# Patient Record
Sex: Male | Born: 1965 | Race: White | Hispanic: No | Marital: Married
Health system: Southern US, Community
[De-identification: ages and names within clinical notes are randomized; demographics above are authoritative.]

---

## 2013-07-31 ENCOUNTER — Other Ambulatory Visit: Payer: Self-pay | Admitting: Family Medicine

## 2013-07-31 DIAGNOSIS — R1013 Epigastric pain: Secondary | ICD-10-CM

## 2013-08-01 ENCOUNTER — Ambulatory Visit
Admission: RE | Admit: 2013-08-01 | Discharge: 2013-08-01 | Disposition: A | Discharge status: Home / Self Care | Payer: BC Managed Care – PPO | Source: Ambulatory Visit | Attending: Family Medicine | Admitting: Family Medicine

## 2013-08-01 DIAGNOSIS — R1013 Epigastric pain: Secondary | ICD-10-CM

## 2014-11-09 IMAGING — US US ABDOMEN LIMITED
1 series · 14 of 25 positions shown · non-contrast
Comparison: None.

CLINICAL DATA: Epigastric pain

EXAM:
US ABDOMEN LIMITED - RIGHT UPPER QUADRANT

[Series 1: us abdomen limited · 0.25mm/px · 14 of 39 slices shown]
[im 1/39]
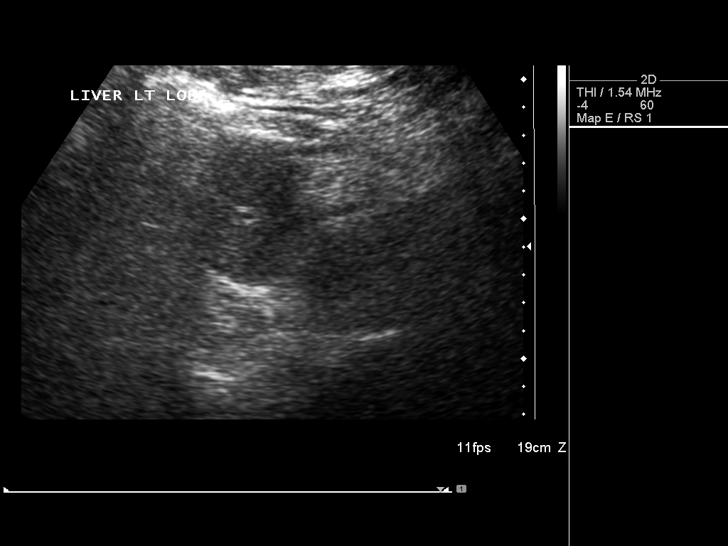
[im 4/39]
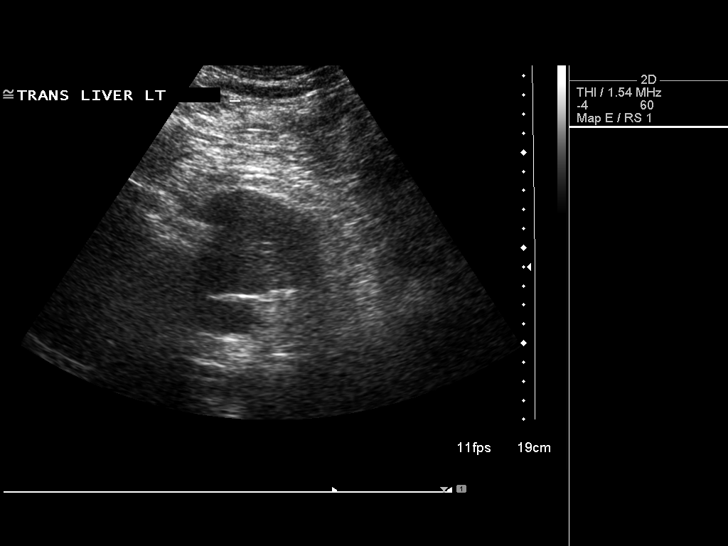
[im 7/39]
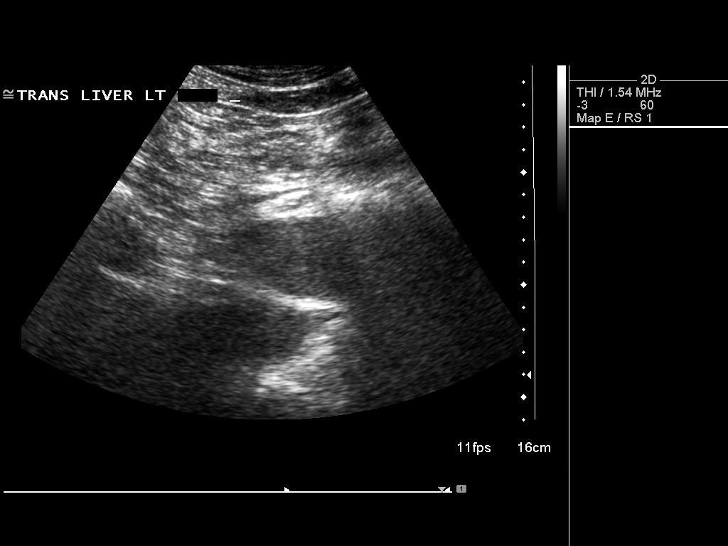
[im 10/39]
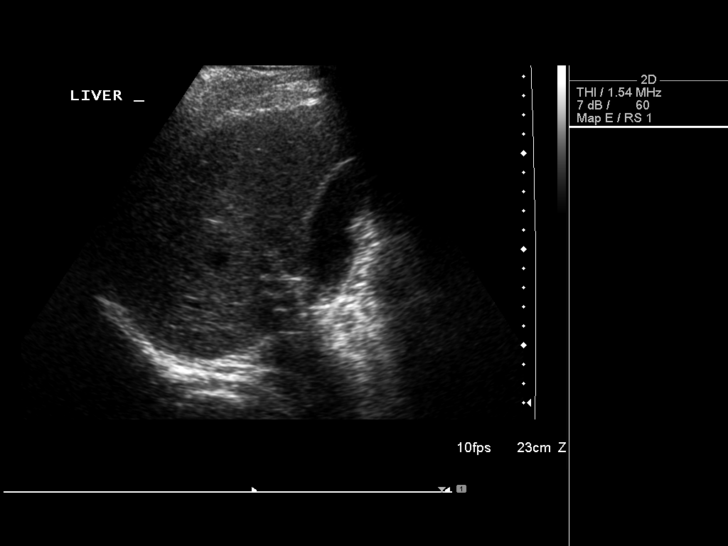
[im 13/39]
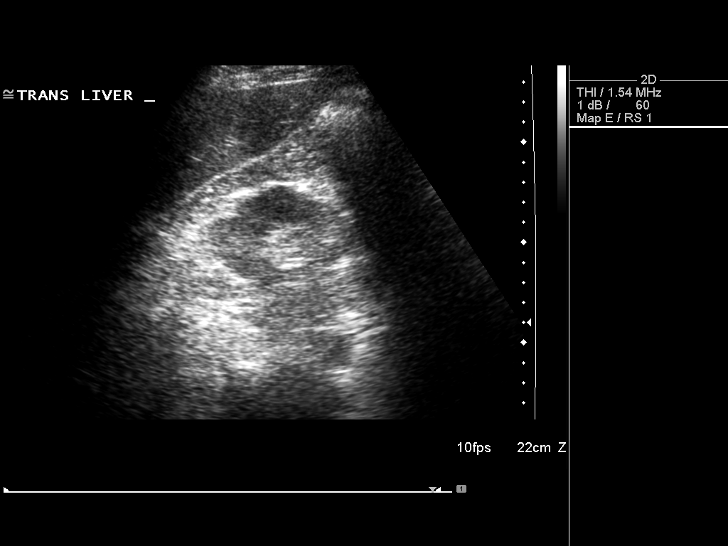
[im 15/39]
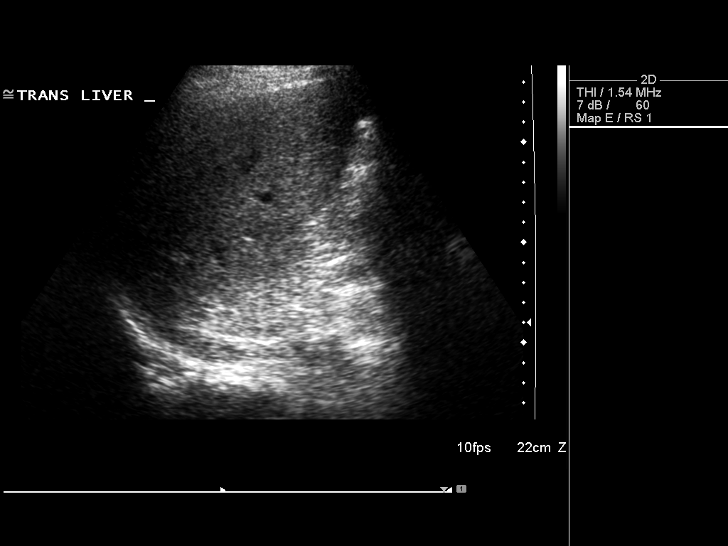
[im 18/39]
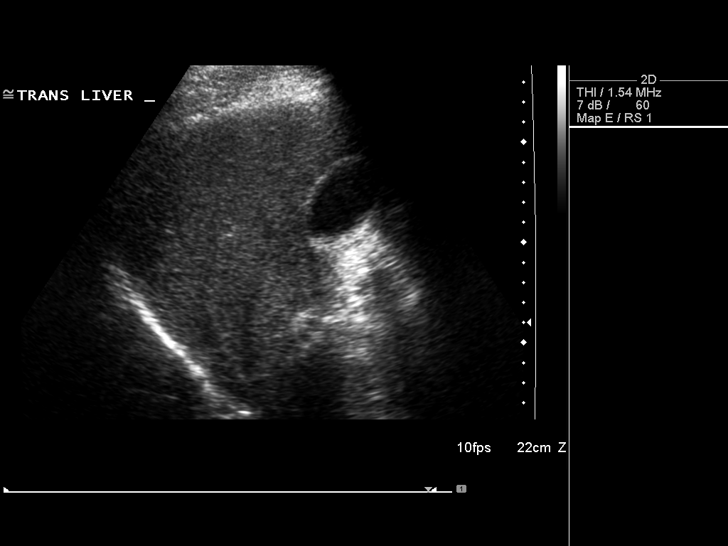
[im 21/39]
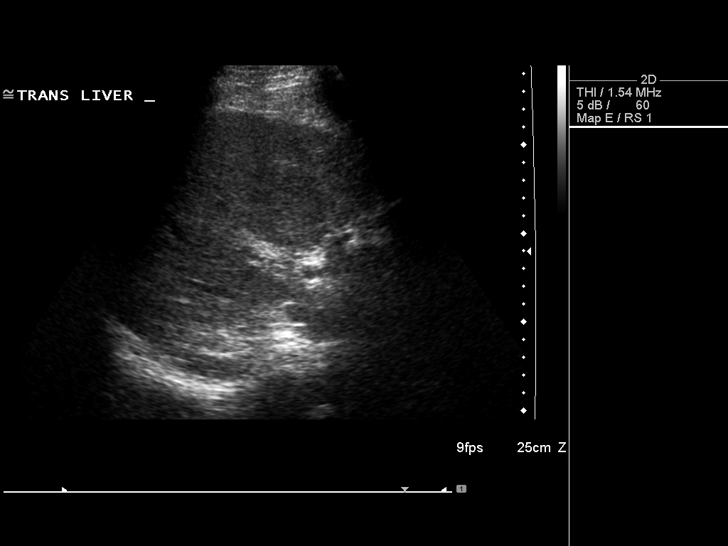
[im 24/39]
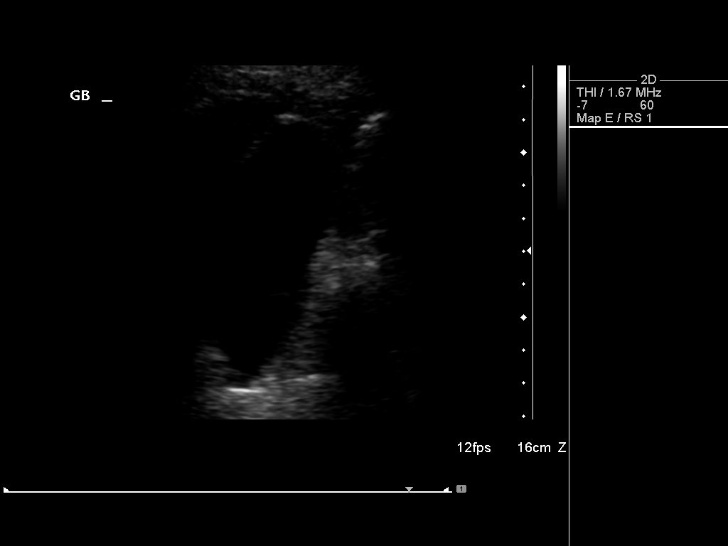
[im 26/39]
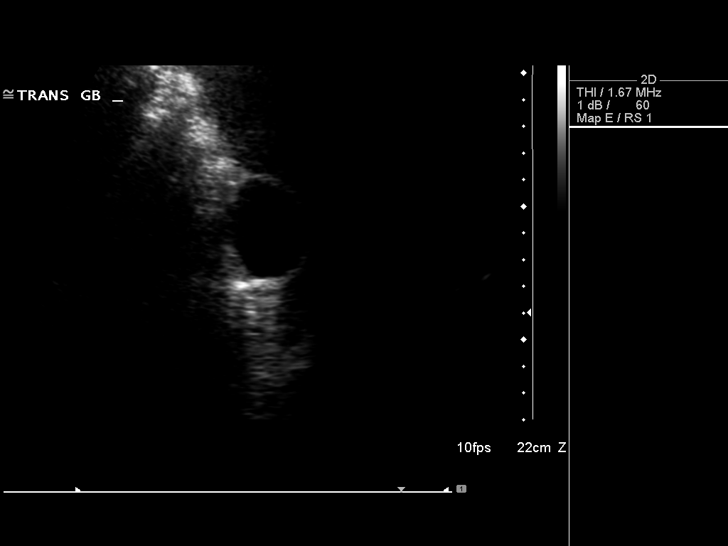
[im 29/39]
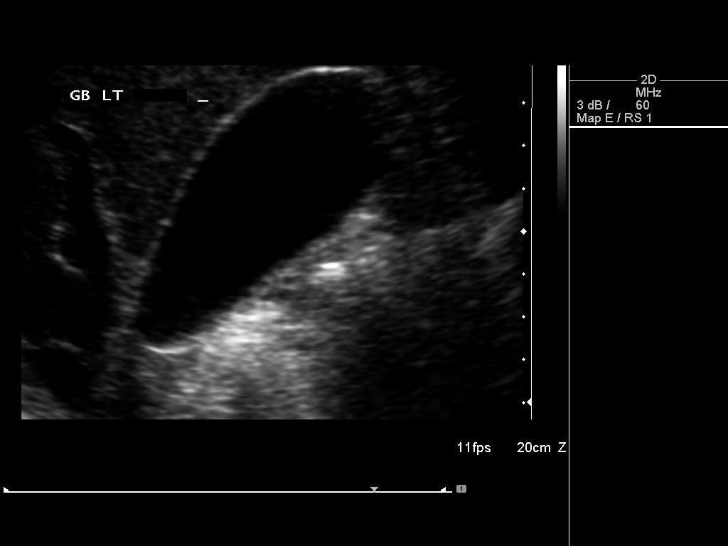
[im 32/39]
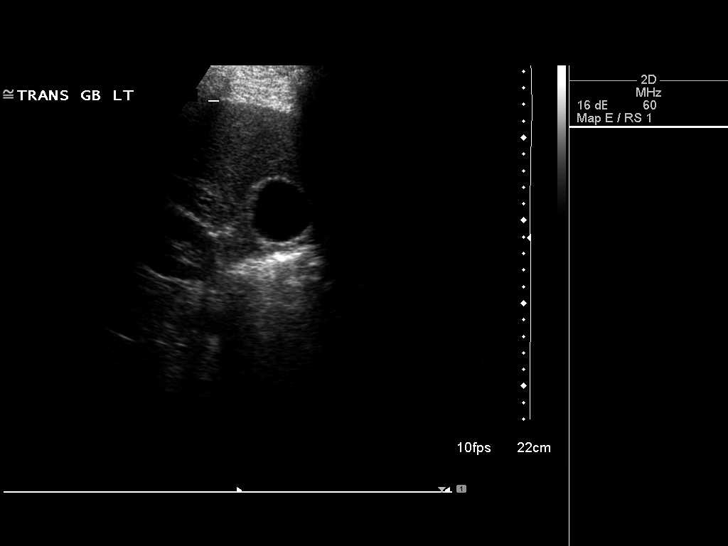
[im 35/39]
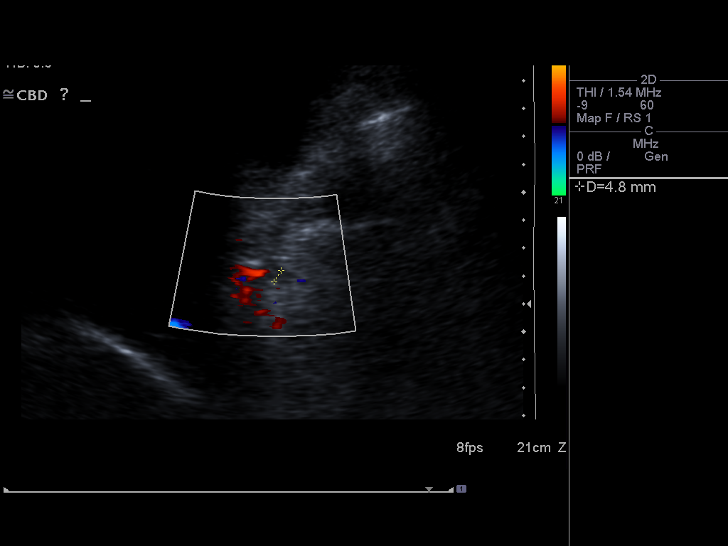
[im 39/39]
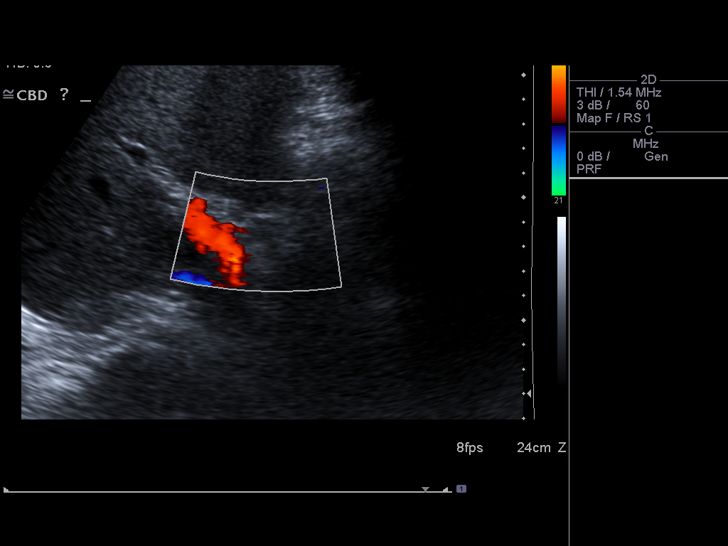

[14 of 25 positions shown; findings below may reference images not displayed]

FINDINGS: Gallbladder:

No gallstones or wall thickening visualized. No sonographic Murphy
sign noted.

Common bile duct:

Diameter: 5 mm in caliber.

Liver:

No focal lesion identified. Within normal limits in parenchymal
echogenicity.
IMPRESSION: Within normal limits.

## 2016-10-11 DIAGNOSIS — Z125 Encounter for screening for malignant neoplasm of prostate: Secondary | ICD-10-CM | POA: Diagnosis not present

## 2016-10-11 DIAGNOSIS — Z23 Encounter for immunization: Secondary | ICD-10-CM | POA: Diagnosis not present

## 2016-10-11 DIAGNOSIS — I10 Essential (primary) hypertension: Secondary | ICD-10-CM | POA: Diagnosis not present

## 2016-10-11 DIAGNOSIS — E781 Pure hyperglyceridemia: Secondary | ICD-10-CM | POA: Diagnosis not present

## 2016-10-11 DIAGNOSIS — Z0000 Encounter for general adult medical examination without abnormal findings: Secondary | ICD-10-CM | POA: Diagnosis not present

## 2017-12-06 DIAGNOSIS — Z125 Encounter for screening for malignant neoplasm of prostate: Secondary | ICD-10-CM | POA: Diagnosis not present

## 2017-12-06 DIAGNOSIS — Z0000 Encounter for general adult medical examination without abnormal findings: Secondary | ICD-10-CM | POA: Diagnosis not present

## 2017-12-06 DIAGNOSIS — I10 Essential (primary) hypertension: Secondary | ICD-10-CM | POA: Diagnosis not present

## 2018-04-17 DIAGNOSIS — K5730 Diverticulosis of large intestine without perforation or abscess without bleeding: Secondary | ICD-10-CM | POA: Diagnosis not present

## 2018-04-17 DIAGNOSIS — Z1211 Encounter for screening for malignant neoplasm of colon: Secondary | ICD-10-CM | POA: Diagnosis not present

## 2018-04-17 DIAGNOSIS — K640 First degree hemorrhoids: Secondary | ICD-10-CM | POA: Diagnosis not present

## 2018-04-17 DIAGNOSIS — D123 Benign neoplasm of transverse colon: Secondary | ICD-10-CM | POA: Diagnosis not present

## 2018-04-20 DIAGNOSIS — D123 Benign neoplasm of transverse colon: Secondary | ICD-10-CM | POA: Diagnosis not present

## 2018-12-25 DIAGNOSIS — Z125 Encounter for screening for malignant neoplasm of prostate: Secondary | ICD-10-CM | POA: Diagnosis not present

## 2018-12-25 DIAGNOSIS — I10 Essential (primary) hypertension: Secondary | ICD-10-CM | POA: Diagnosis not present

## 2018-12-25 DIAGNOSIS — E781 Pure hyperglyceridemia: Secondary | ICD-10-CM | POA: Diagnosis not present

## 2018-12-28 DIAGNOSIS — Z20828 Contact with and (suspected) exposure to other viral communicable diseases: Secondary | ICD-10-CM | POA: Diagnosis not present

## 2019-01-11 DIAGNOSIS — I10 Essential (primary) hypertension: Secondary | ICD-10-CM | POA: Diagnosis not present

## 2019-01-11 DIAGNOSIS — Z125 Encounter for screening for malignant neoplasm of prostate: Secondary | ICD-10-CM | POA: Diagnosis not present

## 2019-01-11 DIAGNOSIS — E781 Pure hyperglyceridemia: Secondary | ICD-10-CM | POA: Diagnosis not present

## 2023-05-12 ENCOUNTER — Other Ambulatory Visit (HOSPITAL_COMMUNITY): Payer: Self-pay | Admitting: Family Medicine

## 2023-05-12 DIAGNOSIS — E782 Mixed hyperlipidemia: Secondary | ICD-10-CM

## 2023-05-24 ENCOUNTER — Other Ambulatory Visit (HOSPITAL_COMMUNITY): Payer: Self-pay

## 2023-06-01 ENCOUNTER — Ambulatory Visit (HOSPITAL_COMMUNITY)
Admission: RE | Admit: 2023-06-01 | Discharge: 2023-06-01 | Disposition: A | Discharge status: Home / Self Care | Payer: Self-pay | Source: Ambulatory Visit | Attending: Family Medicine | Admitting: Family Medicine

## 2023-06-01 DIAGNOSIS — E782 Mixed hyperlipidemia: Secondary | ICD-10-CM | POA: Insufficient documentation
